# Patient Record
Sex: Male | Born: 1965 | Race: White | Hispanic: No | Marital: Single | Smoking: Never smoker
Health system: Southern US, Community
[De-identification: ages and names within clinical notes are randomized; demographics above are authoritative.]

## PROBLEM LIST (undated history)

## (undated) HISTORY — PX: FINGER SURGERY: SHX640

## (undated) HISTORY — PX: WRIST SURGERY: SHX841

## (undated) HISTORY — PX: ROTATOR CUFF REPAIR: SHX139

---

## 2001-08-08 ENCOUNTER — Emergency Department (HOSPITAL_COMMUNITY): Admission: EM | Admit: 2001-08-08 | Discharge: 2001-08-09 | Payer: Self-pay | Admitting: *Deleted

## 2001-08-08 ENCOUNTER — Encounter: Payer: Self-pay | Admitting: *Deleted

## 2003-07-25 ENCOUNTER — Ambulatory Visit (HOSPITAL_BASED_OUTPATIENT_CLINIC_OR_DEPARTMENT_OTHER): Admission: RE | Admit: 2003-07-25 | Discharge: 2003-07-25 | Payer: Self-pay | Admitting: Orthopedic Surgery

## 2005-03-22 ENCOUNTER — Ambulatory Visit (HOSPITAL_COMMUNITY): Admission: RE | Admit: 2005-03-22 | Discharge: 2005-03-22 | Payer: Self-pay | Admitting: Family Medicine

## 2006-02-08 ENCOUNTER — Ambulatory Visit (HOSPITAL_COMMUNITY): Admission: RE | Admit: 2006-02-08 | Discharge: 2006-02-08 | Payer: Self-pay | Admitting: Family Medicine

## 2007-11-28 ENCOUNTER — Ambulatory Visit (HOSPITAL_COMMUNITY): Admission: RE | Admit: 2007-11-28 | Discharge: 2007-11-28 | Payer: Self-pay | Admitting: Family Medicine

## 2007-12-26 ENCOUNTER — Ambulatory Visit: Payer: Self-pay | Admitting: Internal Medicine

## 2008-01-05 ENCOUNTER — Emergency Department (HOSPITAL_COMMUNITY): Admission: EM | Admit: 2008-01-05 | Discharge: 2008-01-05 | Payer: Self-pay | Admitting: Emergency Medicine

## 2008-10-29 ENCOUNTER — Encounter: Admission: RE | Admit: 2008-10-29 | Discharge: 2008-10-29 | Payer: Self-pay | Admitting: Occupational Medicine

## 2011-04-19 NOTE — Assessment & Plan Note (Signed)
NAME:  Eugene Horn, Eugene Horn                CHART#:  [id number]   DATE:  12/26/2007                       DOB:  18-Apr-1966   REFERRING PHYSICIAN:  Scott A. Gerda Diss, M.D.   REASON FOR CONSULTATION:  Elevated liver enzymes.   Eugene Horn is a very pleasant 45 year old Caucasian male sent  over per Dr. Lilyan Punt to further evaluate elevated transaminases.  He was seen last month by Dr. Gerda Diss for what Eugene Horn describes as a  viral syndrome.  According to Dr. Fletcher Anon record, he had a 5-6 day  history of headache, low-grade fever and malaise.  He was, indeed, felt  to have a viral syndrome.  Labs were drawn, and on November 16, 2007, he  was noted to have an AST and ALT of 82 and 94 respectively.  All other  liver parameters were normal.  His amylase came back at 46.  Of note, he  did have a depressed total white count of 2600.  A differential was not  done.  On November 22, 2007, a repeat hepatic profile revealed AST and  ALT of 74 and 123.  At that time, his white count was in the normal  range at 4.7.   Eugene Horn was jaundiced as a newborn, but denies any history of  yellow jaundice or having been told he had hepatitis previously.  He has  never donated blood to the [employer].  He has never received a blood  transfusion.  He denies tattoos, sexual promiscuity or drug use.  He  does take glutamine, a dietary supplement, regularly when he works out,  which predominantly involves weight lifting.  He has never been told  previously that he recalls that his LFT's have been abnormal.  An  ultrasound of the liver was done, which revealed some degree of fatty  infiltration of the liver.  Otherwise, there were no space occupying  lesions or other abnormality.  Additional labs followed on December 03, 2007 including a negative hepatitis C antibody, a negative BE antibody,  hepatitis B surface antibody, hepatitis A total and hepatitis B core  total antibody all came back negative.   His ferritin was normal at 174.  I do not see where he had a hepatitis B surface antigen done, however.   It is also notable that Eugene Horn was taking a fair amount of ibuprofen  during the week of his illness when he sought out help through Dr.  Fletcher Anon office.   PAST MEDICAL HISTORY:  Unremarkable for any chronic illnesses.  He has a  history of undergoing surgery for a fractured wrist years ago.   MEDICATIONS:  He currently is taking no prescribed medications, but,  again, does take over-the-counter [employer] glutamine.   ALLERGIES:  No known drug allergies.   FAMILY HISTORY:  Mother is in good health at age 68.  Father is age 44  in good health with hypertension.  He has 1 brother and 1 sister, both  in good health.   SOCIAL HISTORY:  The patient is single.  He has no children.  He works  for [employer]  He works in the office.  He is not exposed to any  chemicals as far as he knows.  No tobacco, no alcohol, no illicit drug  exposure.   REVIEW  OF SYSTEMS:  As in history of present illness.  Also, he denies  reflux symptoms, odynophagia, dysphagia, early satiety, abdominal pain,  nausea, vomiting, melena, hematochezia, constipation, diarrhea.  He  denies weight loss, fever, chills, dyspnea or chest pain.   PHYSICAL EXAMINATION:  GENERAL:  A pleasant 45 year old gentleman.  VITAL SIGNS:  Weight 200, height 5 feet, 10 inches.  Temperature 97.9,  blood pressure 110/78, pulse 64.  SKIN:  Warm and dry.  There is no jaundice.  No continued stigmata of  chronic liver disease.  HEENT:  No sclerae icterus.  Conjunctivae are pink.  Oral cavity without  lesions.  CHEST:  Lungs are clear to auscultation.  CARDIAC:  Regular rate and rhythm without murmur, gallop or rub.  ABDOMEN:  Nondistended.  Positive bowel sounds, soft, nontender.  No  appreciable mass or organomegaly.  EXTREMITIES:  No edema.   IMPRESSION:  Eugene Horn is a very pleasant 45 year old gentleman with a  nonspecific  elevation in his aminotransferases.  Clinically, these were  noted in a setting of symptoms consistent with a viral syndrome.  He had  associated leukopenia.  Serologies were negative for hepatitic C and  probably negative for hepatitis B, but I did not see where a hepatitis B  surface antigen had been done.  His ferritin was normal.  He has a fatty-  appearing liver on ultrasound, and he is a good 20-25 pounds over his  ideal body weight for body frame/height.   RECOMMENDATIONS:  At this point in time, we would simply like to repeat  a hepatitic profile to see if his transaminases haven't normalized.  Will also draw a hepatitis B surface antigen to complete screening for  viral hepatitis at this time.   He takes glutamine on a regular basis, and it is unknown whether or not  glutamine in significant doses could negatively impact on his  aminotransferases.   A vital syndrome with nonsteroidal anti-inflammatory drug use together  could be more of the culprit in this particular clinical setting.   He may well have an element of underlying steatohepatitis, as well.   RECOMMENDATIONS:  1. Will go ahead and simply repeat a hepatic profile now and obtain a      hepatitis C surface antigen.  2. Regardless of the above findings, I have recommended to Eugene Horn      he plan on getting more aerobic exercise; ideally, 30 minutes of      aerobic exercise every other day or three times weekly and strive      towards an ideal body weight for height/body frame.  Will make      further recommendations in the very near future.   I would like to thank Dr. Lilyan Punt for allowing Horn to see this nice  gentleman today.       Jonathon Bellows, M.D.  Electronically Signed     RMR/MEDQ  D:  12/26/2007  T:  12/26/2007  Job:  [id number]   cc:   Lorin Picket A. Gerda Diss, MD

## 2011-04-22 NOTE — Op Note (Signed)
   NAME:  [name], [name]                         ACCOUNT NO.:  [id number]   MEDICAL RECORD NO.:  [id number]                   PATIENT TYPE:  AMB   LOCATION:  [facility]                                  FACILITY:  [facility]   PHYSICIAN:  Cindee Salt, M.D.                    DATE OF BIRTH:  01-May-1966   DATE OF PROCEDURE:  07/25/2003  DATE OF DISCHARGE:                                 OPERATIVE REPORT   PREOPERATIVE DIAGNOSIS:  Fracture distal condyles, middle phalanx of left  ring finger.   POSTOPERATIVE DIAGNOSIS:  Fracture distal condyles, middle phalanx of left  ring finger.   PROCEDURE:  Closed manipulation and reduction and percutaneous pinning, left  ring finger.   SURGEON:  Cindee Salt, M.D.   ASSISTANTCarolyne Fiscal.   ANESTHESIA:  General.   HISTORY:  The patient is a 45 year old male who suffered a fracture of his  left ring finger playing softball.  It is approximately three weeks old.   DESCRIPTION OF PROCEDURE:  The patient was brought to the operating room, a  general anesthetic carried out without difficulty.  He was prepped using  Duraprep in a supine position, left arm free.  The fracture was reduced  under image intensification.  This was then percutaneously pinned with two  crossed 0.028 K-wires.  AP and lateral oblique x-rays revealed that the  fracture was realigned.  The pins were bent, cut short.  A sterile  compressive dressing and splint to the finger were applied.  The patient  tolerated the procedure well and was taken to the recovery room for  observation in satisfactory condition.  He is discharged home to return to  the [facility] [place] in one week, on Vicodin and Keflex.                                               Cindee Salt, M.D.    Angelique Blonder  D:  07/25/2003  T:  07/26/2003  Job:  [id number]

## 2013-03-09 ENCOUNTER — Encounter (INDEPENDENT_AMBULATORY_CARE_PROVIDER_SITE_OTHER): Payer: Self-pay | Admitting: Ophthalmology

## 2013-12-11 ENCOUNTER — Ambulatory Visit (INDEPENDENT_AMBULATORY_CARE_PROVIDER_SITE_OTHER): Payer: BC Managed Care – PPO | Admitting: Family Medicine

## 2013-12-11 ENCOUNTER — Encounter: Payer: Self-pay | Admitting: Family Medicine

## 2013-12-11 VITALS — BP 102/80 | Ht 70.0 in | Wt 212.5 lb

## 2013-12-11 DIAGNOSIS — J322 Chronic ethmoidal sinusitis: Secondary | ICD-10-CM

## 2013-12-11 MED ORDER — AMOXICILLIN-POT CLAVULANATE 875-125 MG PO TABS
1.0000 | ORAL_TABLET | Freq: Two times a day (BID) | ORAL | Status: AC
Start: 2013-12-11 — End: 2013-12-21

## 2013-12-11 NOTE — Progress Notes (Signed)
   Subjective:    Patient ID: Eugene Horn, male    DOB: 01/25/1966, 48 y.o.   MRN: [id number]  Sinusitis This is a new problem. The current episode started 1 to 4 weeks ago. The problem has been gradually worsening since onset. Maximum temperature: 99. The fever has been present for less than 1 day. His pain is at a severity of 2/10. The pain is moderate. Associated symptoms include congestion and coughing. Past treatments include nothing. The treatment provided no relief.    persisitent cong and drainage leading to cough  No fever  chlorcetin prn cough, Rob CF,  Pos rexsp infxn Pos exposure  Review of Systems  HENT: Positive for congestion.   Respiratory: Positive for cough.    no vomiting no diarrhea no rash ROS otherwise negative     Objective:   Physical Exam  Alert hydration good. Moderate malaise. Cough during exam. Frontal maxillary tenderness evident. Trace normal neck supple. Lungs clear heart regular in rhythm.      Assessment & Plan:  Impression 1 acute rhinosinusitis with bronchitis plan Augmentin twice a day 10 days symptomatic

## 2014-07-14 ENCOUNTER — Encounter: Payer: Self-pay | Admitting: Family Medicine

## 2014-07-14 ENCOUNTER — Ambulatory Visit (INDEPENDENT_AMBULATORY_CARE_PROVIDER_SITE_OTHER): Payer: BC Managed Care – PPO | Admitting: Family Medicine

## 2014-07-14 VITALS — BP 138/86 | Ht 70.0 in | Wt 205.0 lb

## 2014-07-14 DIAGNOSIS — IMO0002 Reserved for concepts with insufficient information to code with codable children: Secondary | ICD-10-CM

## 2014-07-14 DIAGNOSIS — S93609A Unspecified sprain of unspecified foot, initial encounter: Secondary | ICD-10-CM

## 2014-07-14 DIAGNOSIS — S93602S Unspecified sprain of left foot, sequela: Secondary | ICD-10-CM

## 2014-07-14 NOTE — Progress Notes (Signed)
   Subjective:    Patient ID: Eugene Horn, male    DOB: 09/04/1966, 48 y.o.   MRN: [id number]  HPI  Patient jumped off ladder to avoid wasps and landed bad. Patient states he went to Urgent care and they x-rayed it and told him it was not broke and he was given an rx for diclofenac  Review of Systems    see above. Patient complains of pain and discomfort with walking Objective:   Physical Exam Severely swollen foot swelling pain discomfort on the posterior aspect [facility] overall looks good       Assessment & Plan:  I expect slow recovery. If the patient is not able to put weight on it by the middle of next week then the next step would be physical therapy. I see no sign of fracture. If he does not make good improvement over the next few weeks he may need to have some orthopedic referral the possibility of MRI

## 2014-09-11 ENCOUNTER — Telehealth: Payer: Self-pay | Admitting: Family Medicine

## 2014-09-11 DIAGNOSIS — M79673 Pain in unspecified foot: Secondary | ICD-10-CM

## 2014-09-11 NOTE — Telephone Encounter (Signed)
Nurses thought with the patient. [facility] They can do a good job for him. Discussed with patient first and then put referral in thank you reason for referral is persistent foot pain

## 2014-09-11 NOTE — Telephone Encounter (Signed)
[place] still having problems with his foot. Last visit was 07/14/14.

## 2014-09-12 NOTE — Telephone Encounter (Signed)
Referral initiated in system. Patient was notified.

## 2014-09-12 NOTE — Addendum Note (Signed)
Addended by: Dereck LigasJOHNSON, Manuella Blackson P on: 09/12/2014 11:03 AM   Modules accepted: Orders

## 2015-05-18 ENCOUNTER — Encounter: Payer: Self-pay | Admitting: Family Medicine

## 2015-05-18 ENCOUNTER — Ambulatory Visit (HOSPITAL_COMMUNITY)
Admission: RE | Admit: 2015-05-18 | Discharge: 2015-05-18 | Disposition: A | Discharge status: Home / Self Care | Payer: BLUE CROSS/BLUE SHIELD | Source: Ambulatory Visit | Attending: Family Medicine | Admitting: Family Medicine

## 2015-05-18 ENCOUNTER — Ambulatory Visit (INDEPENDENT_AMBULATORY_CARE_PROVIDER_SITE_OTHER): Payer: BLUE CROSS/BLUE SHIELD | Admitting: Family Medicine

## 2015-05-18 VITALS — BP 118/88 | Ht 70.0 in | Wt 207.0 lb

## 2015-05-18 DIAGNOSIS — R079 Chest pain, unspecified: Secondary | ICD-10-CM | POA: Diagnosis not present

## 2015-05-18 MED ORDER — NABUMETONE 750 MG PO TABS
750.0000 mg | ORAL_TABLET | Freq: Two times a day (BID) | ORAL | Status: DC
Start: 2015-05-18 — End: 2016-02-02

## 2015-05-18 NOTE — Progress Notes (Signed)
   Subjective:    Patient ID: Eugene Horn, male    DOB: [birth year], 49 y.o.   MRN: [id number]  Chest Pain  This is a new problem. Episode onset: 3 weeks ago. Pain location: right side. The quality of the pain is described as tightness. Associated symptoms comments: Pain worse when sneezing or coughing. He has tried NSAIDs (ice) for the symptoms. The treatment provided no relief.   sarted a few wks ago.  Played ball and got fouled pretty good,  Took a falll not sure if fell on involved side  A few days later started hurtng    Right lat chest wall tend, and sore  Took ibu the first wk,,then stopped  This weekend hurt a bit more, worse with sudden motion and sneezing and cough  Non smker   Still exrcising  No hx of cracked ribs  No sec exposure    Review of Systems  Cardiovascular: Positive for chest pain.   no vomiting no diarrhea good appetite     Objective:   Physical Exam  Alert vitals stable lungs clear. Heart regular in rhythm right lateral chest tenderness deep palpation lungs clear heart regular rhythm abdomen benign      Assessment & Plan:  Impression likely contusion with secondary costochondritis discussed plan chest x-ray. Trial of Relafen twice a day. Symptom care discussed WSL

## 2016-02-02 ENCOUNTER — Ambulatory Visit (INDEPENDENT_AMBULATORY_CARE_PROVIDER_SITE_OTHER): Payer: BLUE CROSS/BLUE SHIELD | Admitting: Family Medicine

## 2016-02-02 ENCOUNTER — Encounter: Payer: Self-pay | Admitting: Family Medicine

## 2016-02-02 VITALS — BP 124/86 | Temp 100.2°F | Ht 70.0 in | Wt 223.0 lb

## 2016-02-02 DIAGNOSIS — J111 Influenza due to unidentified influenza virus with other respiratory manifestations: Secondary | ICD-10-CM | POA: Diagnosis not present

## 2016-02-02 MED ORDER — OSELTAMIVIR PHOSPHATE 75 MG PO CAPS: 75.0000 mg | ORAL_CAPSULE | Freq: Two times a day (BID) | ORAL | Status: DC

## 2016-02-02 NOTE — Progress Notes (Signed)
   Subjective:    Patient ID: Eugene Horn, male    DOB: 08/25/1966, 50 y.o.   MRN: [id number]  Cough This is a new problem. Episode onset: 2 days ago. Associated symptoms include a fever, nasal congestion and a sore throat. Treatments tried: mucinex DM.   Patient with onset over the past 36 hours sore throat runny nose head congestion cough body aches headache low-grade fever no wheezing or difficulty breathing no nausea vomiting diarrhea   Review of Systems  Constitutional: Positive for fever.  HENT: Positive for sore throat.   Respiratory: Positive for cough.        Objective:   Physical Exam Sinus nontender throat normal neck supple lungs clear heart regular.   I do not find evidence of bacterial infection if progressive trouble follow-up    Assessment & Plan:  flulike illness Influenza-the patient was diagnosed with influenza. Patient/family educated about the flu and warning signs to watch for. If difficulty breathing, severe neck pain and stiffness, cyanosis, disorientation, or progressive worsening then immediately get rechecked at that ER. If progressive symptoms be certain to be rechecked. Supportive measures such as Tylenol/ibuprofen was discussed. No aspirin use in children. And influenza home care instruction sheet was given.

## 2016-02-02 NOTE — Patient Instructions (Signed)

## 2016-02-09 ENCOUNTER — Telehealth: Payer: Self-pay | Admitting: Family Medicine

## 2016-02-09 NOTE — Telephone Encounter (Signed)
Nurses please discuss the case with patient making sure he is not short of breath or running high fevers if he is short of breath or running high fevers I recommend to be rechecked. If it is more just congestion and coughing I recommend Z-Pak with a follow-up office visit if ongoing troubles

## 2016-02-09 NOTE — Telephone Encounter (Signed)
Was told to call back if he got post flu symptoms  He has cough and chest congestion now if you could go ahead And send [facility] a [facility]

## 2016-02-10 MED ORDER — AZITHROMYCIN 250 MG PO TABS: ORAL_TABLET | ORAL | Status: DC

## 2016-02-10 NOTE — Telephone Encounter (Signed)
Spoke with patient and discussed symptoms. Patient stated he just has cough and congestion. Denies shortness of breath and high fevers. Informed patient per Dr.Scott Luking that we can send in Zpak for cough with a follow up office visit if ongoing troubles. Patient verbalized understanding. [facility]pak sent to [facility].

## 2016-02-10 NOTE — Telephone Encounter (Signed)
[facility] 02/10/16 

## 2017-06-01 ENCOUNTER — Ambulatory Visit (INDEPENDENT_AMBULATORY_CARE_PROVIDER_SITE_OTHER): Payer: BLUE CROSS/BLUE SHIELD | Admitting: Family Medicine

## 2017-06-01 ENCOUNTER — Encounter: Payer: Self-pay | Admitting: Family Medicine

## 2017-06-01 VITALS — BP 118/74 | Ht 69.0 in | Wt 220.0 lb

## 2017-06-01 DIAGNOSIS — Z0000 Encounter for general adult medical examination without abnormal findings: Secondary | ICD-10-CM

## 2017-06-01 DIAGNOSIS — R0789 Other chest pain: Secondary | ICD-10-CM

## 2017-06-01 DIAGNOSIS — Z13220 Encounter for screening for lipoid disorders: Secondary | ICD-10-CM

## 2017-06-01 NOTE — Progress Notes (Signed)
Subjective:    Patient ID: Eugene Horn, male    DOB: 02/08/1966, 51 y.o.   MRN: [id number]  HPI The patient comes in today for a wellness visit.    A review of their health history was completed.  A review of medications was also completed.  Any needed refills; not taking any meds  Eating habits: health conscious  Falls/  MVA accidents in past few months: none  Regular exercise: workout 2 days a week  Specialist pt sees on regular basis: none  Preventative health issues were discussed.   Additional concerns: none Patient does relate that he's had chest tightness. He states he had a friend who died unexpectedly at age 51 this proved him. He states he is having some tightness across the lower chest. He denies any injury to it. Denies injuring it with lifting does a lot of weight lifting. He does not do excessive amount of cardio. When he does do cardio he does not experience any type of chest tightness he states when the chest tightness does occur it's in the lower chest and is from the right shoulder region to the lower chest he is had previous right shoulder troubles he does not have any family history of premature heart disease   Review of Systems  Constitutional: Negative for activity change, fatigue and fever.  Respiratory: Negative for cough and shortness of breath.   Cardiovascular: Negative for chest pain and leg swelling.  Neurological: Negative for headaches.       Objective:   Physical Exam  Constitutional: He appears well-developed and well-nourished.  HENT:  Head: Normocephalic and atraumatic.  Right Ear: External ear normal.  Left Ear: External ear normal.  Nose: Nose normal.  Mouth/Throat: Oropharynx is clear and moist.  Eyes: EOM are normal. Pupils are equal, round, and reactive to light.  Neck: Normal range of motion. Neck supple. No thyromegaly present.  Cardiovascular: Normal rate, regular rhythm and normal heart sounds.   No murmur  heard. Pulmonary/Chest: Effort normal and breath sounds normal. No respiratory distress. He has no wheezes.  Abdominal: Soft. Bowel sounds are normal. He exhibits no distension and no mass. There is no tenderness.  Genitourinary: Penis normal.  Musculoskeletal: Normal range of motion. He exhibits no edema.  Lymphadenopathy:    He has no cervical adenopathy.  Neurological: He is alert. He exhibits normal muscle tone.  Skin: Skin is warm and dry. No erythema.  Psychiatric: He has a normal mood and affect. His behavior is normal. Judgment normal.          Assessment & Plan:  Adult wellness-complete.wellness physical was conducted today. Importance of diet and exercise were discussed in detail. In addition to this a discussion regarding safety was also covered. We also reviewed over immunizations and gave recommendations regarding current immunization needed for age. In addition to this additional areas were also touched on including: Preventative health exams needed: Colonoscopy not indicated currently-insurance companies will not reimburse for colonoscopy until age 49 no family history  Patient was advised yearly wellness exam  Prostate exam on next physical Lipid metastases 7 ordered Importance of losing weight discuss  Chest tightness I do not feel that this is coronary. Regular physical exercise recommended along with healthy eating and losing weight if ongoing troubles follow-up EKG looks good Patient was encouraged to stay physically active on a regular basis and also encouraged to do cardio in if he has any chest tightness pressure pain with cardio to report to us  ASAP

## 2017-06-12 ENCOUNTER — Encounter: Payer: Self-pay | Admitting: Family Medicine

## 2017-09-26 ENCOUNTER — Telehealth: Payer: Self-pay | Admitting: *Deleted

## 2017-09-26 DIAGNOSIS — Z139 Encounter for screening, unspecified: Secondary | ICD-10-CM

## 2017-09-26 DIAGNOSIS — Z131 Encounter for screening for diabetes mellitus: Secondary | ICD-10-CM

## 2017-09-26 DIAGNOSIS — Z13220 Encounter for screening for lipoid disorders: Secondary | ICD-10-CM

## 2017-09-26 NOTE — Telephone Encounter (Signed)
Patient had lipid and liver 05/2017 (son at age 49 should have been vaccinated against varicella?)

## 2017-09-26 NOTE — Telephone Encounter (Signed)
Patient called with questions about getting lab work done, patient also stated he doesn't think he had chicken pox's as a kid and he would like to know if he can get checked for that when he does labs because he has a son who is 4 and he has not had them yet. Please advise [phone]

## 2017-09-26 NOTE — Telephone Encounter (Signed)
Varicella antibody, lipid, liver, metabolic 7

## 2017-09-27 NOTE — Telephone Encounter (Signed)
Blood work ordered in EPIC. Patient notified. 

## 2017-09-29 LAB — LIPID PANEL
CHOLESTEROL TOTAL: 145 mg/dL (ref 100–199)
Chol/HDL Ratio: 4.1 ratio (ref 0.0–5.0)
HDL: 35 mg/dL — AB (ref >39)
LDL Calculated: 95 mg/dL (ref 0–99)
Triglycerides: 77 mg/dL (ref 0–149)
VLDL CHOLESTEROL CAL: 15 mg/dL (ref 5–40)

## 2017-09-29 LAB — BASIC METABOLIC PANEL
BUN/Creatinine Ratio: 21 — ABNORMAL HIGH (ref 9–20)
BUN: 18 mg/dL (ref 6–24)
CO2: 24 mmol/L (ref 20–29)
Calcium: 9.2 mg/dL (ref 8.7–10.2)
Chloride: 104 mmol/L (ref 96–106)
Creatinine, Ser: 0.86 mg/dL (ref 0.76–1.27)
GFR calc Af Amer: 118 mL/min/{1.73_m2} (ref >59)
GFR calc non Af Amer: 102 mL/min/{1.73_m2} (ref >59)
GLUCOSE: 92 mg/dL (ref 65–99)
POTASSIUM: 4.5 mmol/L (ref 3.5–5.2)
SODIUM: 140 mmol/L (ref 134–144)

## 2017-09-29 LAB — HEPATIC FUNCTION PANEL
ALK PHOS: 95 IU/L (ref 39–117)
ALT: 17 IU/L (ref 0–44)
AST: 17 IU/L (ref 0–40)
Albumin: 4.4 g/dL (ref 3.5–5.5)
Bilirubin Total: 0.4 mg/dL (ref 0.0–1.2)
Bilirubin, Direct: 0.1 mg/dL (ref 0.00–0.40)
TOTAL PROTEIN: 6.7 g/dL (ref 6.0–8.5)

## 2017-09-29 LAB — VARICELLA ZOSTER ANTIBODY, IGG: VARICELLA: 629 {index} (ref >165)

## 2017-11-03 ENCOUNTER — Ambulatory Visit: Payer: BLUE CROSS/BLUE SHIELD | Admitting: Nurse Practitioner

## 2017-11-03 ENCOUNTER — Encounter: Payer: Self-pay | Admitting: Nurse Practitioner

## 2017-11-03 ENCOUNTER — Ambulatory Visit (HOSPITAL_COMMUNITY)
Admission: RE | Admit: 2017-11-03 | Discharge: 2017-11-03 | Disposition: A | Discharge status: Home / Self Care | Payer: BLUE CROSS/BLUE SHIELD | Source: Ambulatory Visit | Attending: Nurse Practitioner | Admitting: Nurse Practitioner

## 2017-11-03 VITALS — BP 122/82 | Ht 69.0 in | Wt 220.4 lb

## 2017-11-03 DIAGNOSIS — S0083XA Contusion of other part of head, initial encounter: Secondary | ICD-10-CM

## 2017-11-03 DIAGNOSIS — X58XXXA Exposure to other specified factors, initial encounter: Secondary | ICD-10-CM | POA: Insufficient documentation

## 2017-11-03 NOTE — Progress Notes (Signed)
Subjective: Presents for complaints of pain and swelling along the left side of the nose near the eye after getting elbowed in the area while playing basketball about 12 hours ago.  States his nose was dislocated, patient put nose back in place himself.  Had a lot of bleeding afterwards which has stopped.  Minimal headache.  No loss of consciousness.  No blurred vision.  No nausea vomiting.  No dizziness.  Objective:   BP 122/82   Ht 5\' 9"  (1.753 m)   Wt 220 lb 6.4 oz (100 kg)   BMI 32.55 kg/m  NAD.  Alert, oriented.  Pupils equal and reactive to light.  Funduscopic exam normal limit.  EOMs intact without nystagmus.  Very faint linear ecchymotic area noted along the left lateral upper nasal area with mild edema.  Nose is very slightly out of alignment but this could be normal.  Nasal cavities are clear.  Faint ecchymotic area noted around the inner left eye but no edema.  No tenderness with palpation around the right eye.  Mild tenderness around the left eye towards the upper nose.  Assessment:  Contusion of face, initial encounter - Plan: DG Facial Bones Complete    Plan: Facial x-ray pending ibuprofen as directed for discomfort.  Ice applications for the next 48 hours.  Further follow-up based on x-ray results.  Warning signs reviewed regarding concussion as a precaution.

## 2018-05-23 IMAGING — DX DG FACIAL BONES COMPLETE 3+V
3 series · 3 of 3 positions shown · non-contrast
Comparison: None.

CLINICAL DATA: Hit in the left eye and along the left nose last
night.

EXAM:
FACIAL BONES COMPLETE 3+V

[facial waters]
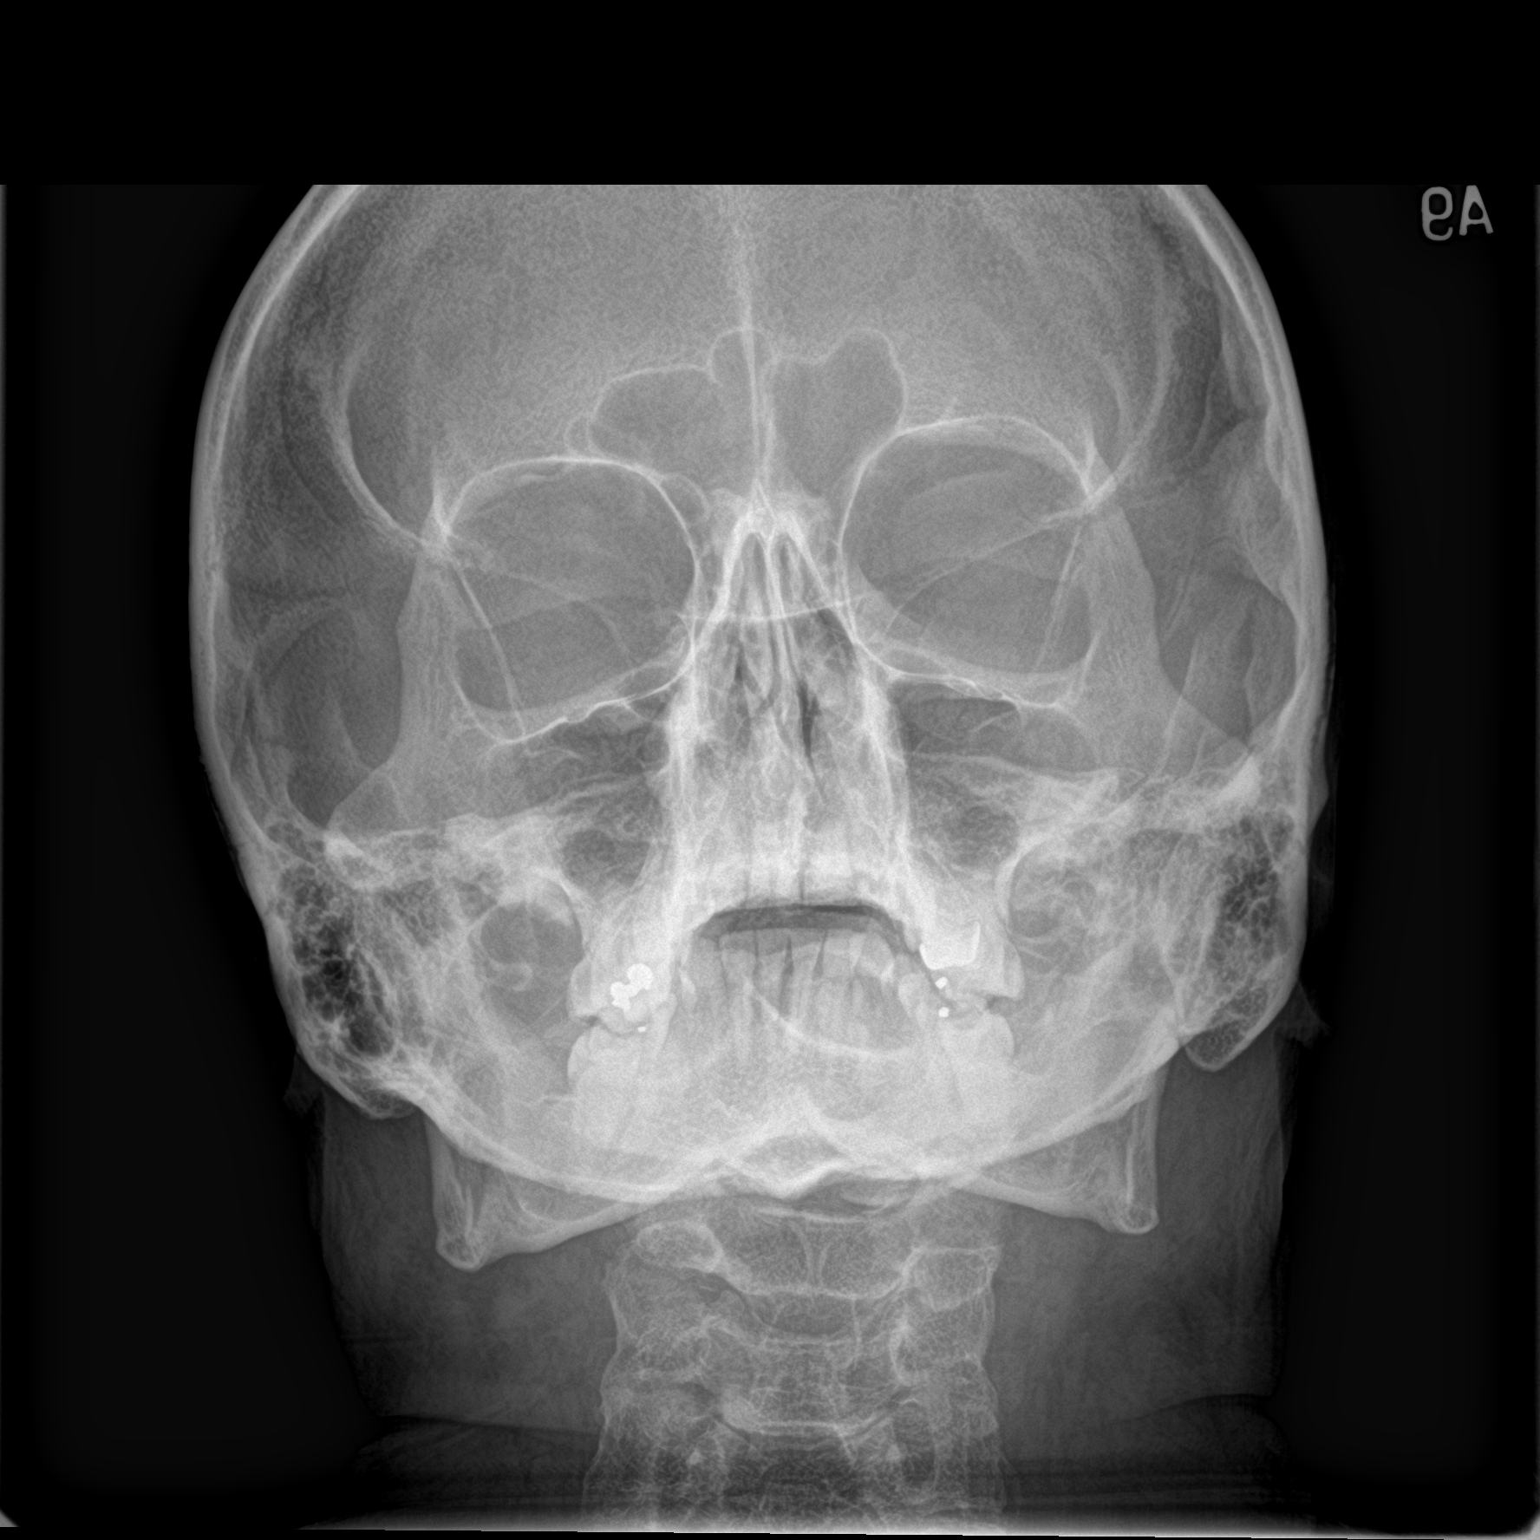

[facial lateral]
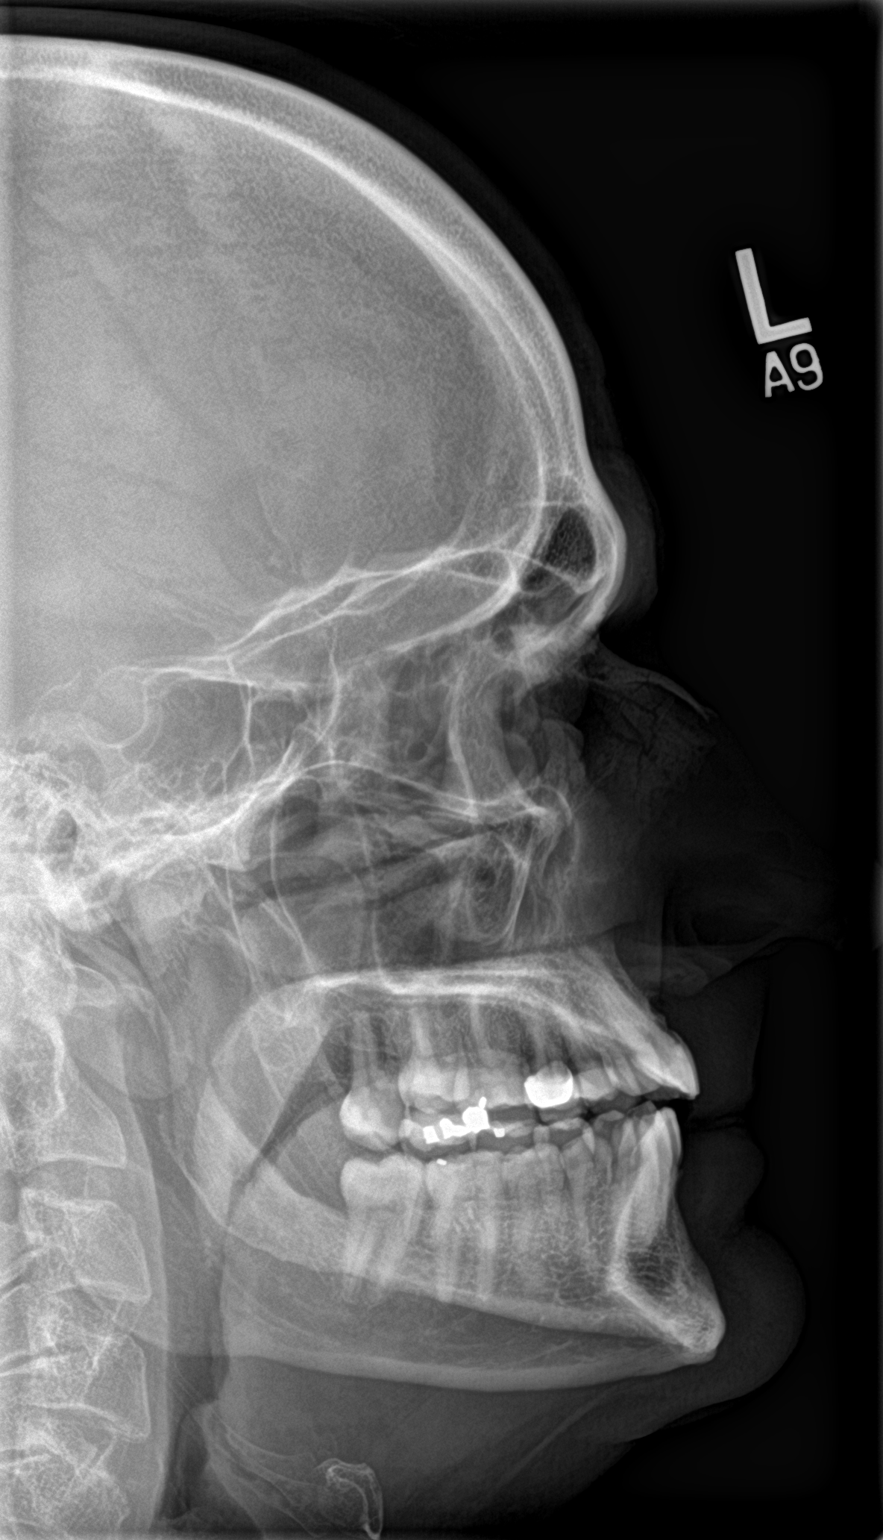

[mandible townes]
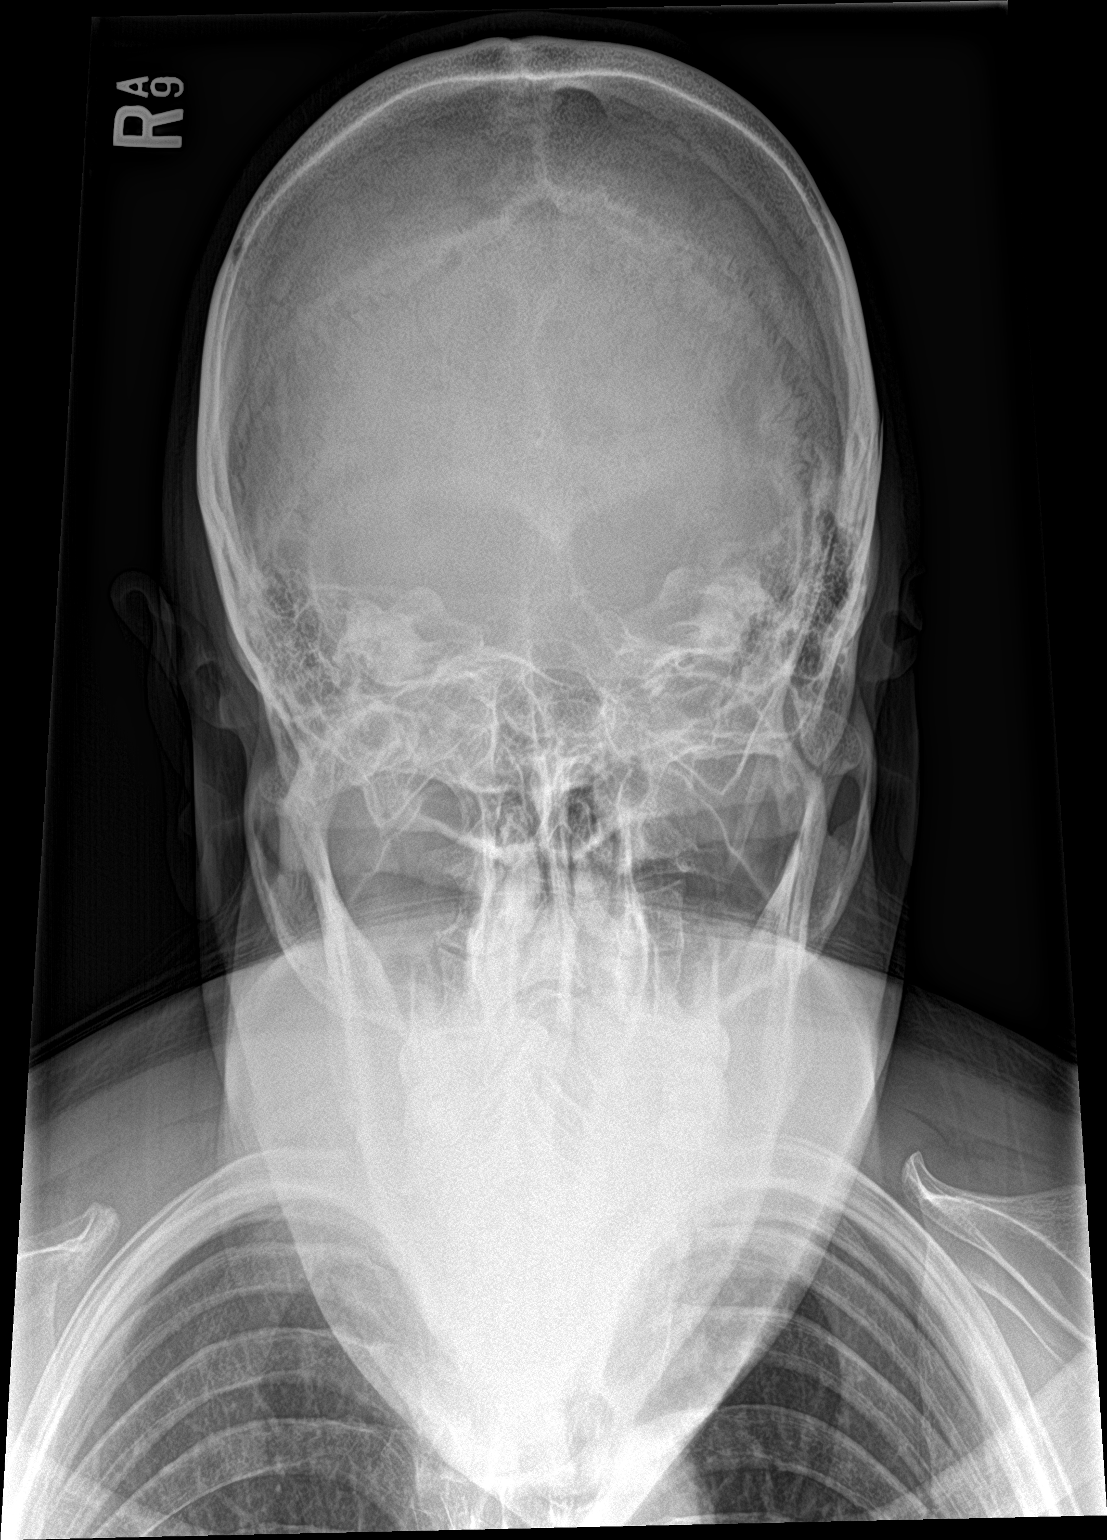

[3 of 3 positions shown; findings below may reference images not displayed]

FINDINGS: There is no evidence of fracture or other significant bone
abnormality. No orbital emphysema or sinus air-fluid levels are
seen.
IMPRESSION: Negative.

## 2019-09-12 ENCOUNTER — Telehealth: Payer: Self-pay | Admitting: Family Medicine

## 2019-09-12 DIAGNOSIS — Z13220 Encounter for screening for lipoid disorders: Secondary | ICD-10-CM

## 2019-09-12 DIAGNOSIS — Z131 Encounter for screening for diabetes mellitus: Secondary | ICD-10-CM

## 2019-09-12 DIAGNOSIS — R5383 Other fatigue: Secondary | ICD-10-CM

## 2019-09-12 DIAGNOSIS — Z125 Encounter for screening for malignant neoplasm of prostate: Secondary | ICD-10-CM

## 2019-09-12 DIAGNOSIS — Z1211 Encounter for screening for malignant neoplasm of colon: Secondary | ICD-10-CM

## 2019-09-12 NOTE — Telephone Encounter (Signed)
Pt having a physical & needs "complete" blood work   Please call when lab orders are ready   Pt requesting referral to Dr. Earlean Shawl for a screening colonoscopy Fax# [phone]  Please advise & if OK to refer initiate referral in system so that I may process

## 2019-09-12 NOTE — Telephone Encounter (Signed)
Lipid liver met 7 cbc psa

## 2019-09-13 NOTE — Telephone Encounter (Signed)
Blood work ordered in [employer]. Referral ordered in Epic. Patient notified.

## 2019-09-13 NOTE — Telephone Encounter (Signed)
May go ahead with referral [facility]

## 2019-09-13 NOTE — Telephone Encounter (Signed)
Blood work ordered in [employer] Patient notified. Do you want to process screening colonoscopy now or wait till physical to discuss with patient. Please advise

## 2019-09-20 LAB — BASIC METABOLIC PANEL
BUN/Creatinine Ratio: 18 (ref 9–20)
BUN: 18 mg/dL (ref 6–24)
CO2: 24 mmol/L (ref 20–29)
Calcium: 9.3 mg/dL (ref 8.7–10.2)
Chloride: 104 mmol/L (ref 96–106)
Creatinine, Ser: 1 mg/dL (ref 0.76–1.27)
GFR calc Af Amer: 100 mL/min/{1.73_m2} (ref >59)
GFR calc non Af Amer: 87 mL/min/{1.73_m2} (ref >59)
Glucose: 100 mg/dL — ABNORMAL HIGH (ref 65–99)
Potassium: 4.6 mmol/L (ref 3.5–5.2)
Sodium: 141 mmol/L (ref 134–144)

## 2019-09-20 LAB — CBC WITH DIFFERENTIAL/PLATELET
Basophils Absolute: 0.1 10*3/uL (ref 0.0–0.2)
Basos: 1 %
EOS (ABSOLUTE): 0.2 10*3/uL (ref 0.0–0.4)
Eos: 4 %
Hematocrit: 47.6 % (ref 37.5–51.0)
Hemoglobin: 15.8 g/dL (ref 13.0–17.7)
Immature Grans (Abs): 0 10*3/uL (ref 0.0–0.1)
Immature Granulocytes: 1 %
Lymphocytes Absolute: 1.9 10*3/uL (ref 0.7–3.1)
Lymphs: 30 %
MCH: 30 pg (ref 26.6–33.0)
MCHC: 33.2 g/dL (ref 31.5–35.7)
MCV: 91 fL (ref 79–97)
Monocytes Absolute: 0.4 10*3/uL (ref 0.1–0.9)
Monocytes: 7 %
Neutrophils Absolute: 3.6 10*3/uL (ref 1.4–7.0)
Neutrophils: 57 %
Platelets: 273 10*3/uL (ref 150–450)
RBC: 5.26 x10E6/uL (ref 4.14–5.80)
RDW: 13.1 % (ref 11.6–15.4)
WBC: 6.2 10*3/uL (ref 3.4–10.8)

## 2019-09-20 LAB — LIPID PANEL
Chol/HDL Ratio: 3.9 ratio (ref 0.0–5.0)
Cholesterol, Total: 158 mg/dL (ref 100–199)
HDL: 41 mg/dL (ref >39)
LDL Chol Calc (NIH): 98 mg/dL (ref 0–99)
Triglycerides: 100 mg/dL (ref 0–149)
VLDL Cholesterol Cal: 19 mg/dL (ref 5–40)

## 2019-09-20 LAB — HEPATIC FUNCTION PANEL
ALT: 27 IU/L (ref 0–44)
AST: 27 IU/L (ref 0–40)
Albumin: 4.6 g/dL (ref 3.8–4.9)
Alkaline Phosphatase: 98 IU/L (ref 39–117)
Bilirubin Total: 0.3 mg/dL (ref 0.0–1.2)
Bilirubin, Direct: 0.09 mg/dL (ref 0.00–0.40)
Total Protein: 7.2 g/dL (ref 6.0–8.5)

## 2019-09-20 LAB — PSA: Prostate Specific Ag, Serum: 0.7 ng/mL (ref 0.0–4.0)

## 2019-09-22 ENCOUNTER — Encounter: Payer: Self-pay | Admitting: Family Medicine

## 2019-10-08 ENCOUNTER — Other Ambulatory Visit: Payer: Self-pay

## 2019-10-08 ENCOUNTER — Ambulatory Visit (INDEPENDENT_AMBULATORY_CARE_PROVIDER_SITE_OTHER): Payer: Managed Care, Other (non HMO) | Admitting: Family Medicine

## 2019-10-08 VITALS — BP 120/78 | Temp 98.3°F | Ht 69.0 in | Wt 224.2 lb

## 2019-10-08 DIAGNOSIS — Z0000 Encounter for general adult medical examination without abnormal findings: Secondary | ICD-10-CM | POA: Diagnosis not present

## 2019-10-08 NOTE — Patient Instructions (Signed)
Results for orders placed or performed in visit on 09/12/19  Lipid panel  Result Value Ref Range   Cholesterol, Total 158 100 - 199 mg/dL   Triglycerides 100 0 - 149 mg/dL   HDL 41 >39 mg/dL   VLDL Cholesterol Cal 19 5 - 40 mg/dL   LDL Chol Calc (NIH) 98 0 - 99 mg/dL   Chol/HDL Ratio 3.9 0.0 - 5.0 ratio  Hepatic function panel  Result Value Ref Range   Total Protein 7.2 6.0 - 8.5 g/dL   Albumin 4.6 3.8 - 4.9 g/dL   Bilirubin Total 0.3 0.0 - 1.2 mg/dL   Bilirubin, Direct 0.09 0.00 - 0.40 mg/dL   Alkaline Phosphatase 98 39 - 117 IU/L   AST 27 0 - 40 IU/L   ALT 27 0 - 44 IU/L  Basic metabolic panel  Result Value Ref Range   Glucose 100 (H) 65 - 99 mg/dL   BUN 18 6 - 24 mg/dL   Creatinine, Ser 1.00 0.76 - 1.27 mg/dL   GFR calc non Af Amer 87 >59 mL/min/1.73   GFR calc Af Amer 100 >59 mL/min/1.73   BUN/Creatinine Ratio 18 9 - 20   Sodium 141 134 - 144 mmol/L   Potassium 4.6 3.5 - 5.2 mmol/L   Chloride 104 96 - 106 mmol/L   CO2 24 20 - 29 mmol/L   Calcium 9.3 8.7 - 10.2 mg/dL  CBC with Differential/Platelet  Result Value Ref Range   WBC 6.2 3.4 - 10.8 x10E3/uL   RBC 5.26 4.14 - 5.80 x10E6/uL   Hemoglobin 15.8 13.0 - 17.7 g/dL   Hematocrit 47.6 37.5 - 51.0 %   MCV 91 79 - 97 fL   MCH 30.0 26.6 - 33.0 pg   MCHC 33.2 31.5 - 35.7 g/dL   RDW 13.1 11.6 - 15.4 %   Platelets 273 150 - 450 x10E3/uL   Neutrophils 57 Not Estab. %   Lymphs 30 Not Estab. %   Monocytes 7 Not Estab. %   Eos 4 Not Estab. %   Basos 1 Not Estab. %   Neutrophils Absolute 3.6 1.4 - 7.0 x10E3/uL   Lymphocytes Absolute 1.9 0.7 - 3.1 x10E3/uL   Monocytes Absolute 0.4 0.1 - 0.9 x10E3/uL   EOS (ABSOLUTE) 0.2 0.0 - 0.4 x10E3/uL   Basophils Absolute 0.1 0.0 - 0.2 x10E3/uL   Immature Granulocytes 1 Not Estab. %   Immature Grans (Abs) 0.0 0.0 - 0.1 x10E3/uL  PSA  Result Value Ref Range   Prostate Specific Ag, Serum 0.7 0.0 - 4.0 ng/mL

## 2019-10-08 NOTE — Progress Notes (Signed)
Subjective:    Patient ID: Eugene Horn, male    DOB: 25-Feb-1966, 53 y.o.   MRN: [id number]  HPI  The patient comes in today for a wellness visit. Patient overall tries to eat healthy stay physically active try to lose weight exercising some under some stress but denies being depressed no accidents or injuries will be getting a colonoscopy later this year Results for orders placed or performed in visit on 09/12/19  Lipid panel  Result Value Ref Range   Cholesterol, Total 158 100 - 199 mg/dL   Triglycerides 093 0 - 149 mg/dL   HDL 41 >81 mg/dL   VLDL Cholesterol Cal 19 5 - 40 mg/dL   LDL Chol Calc (NIH) 98 0 - 99 mg/dL   Chol/HDL Ratio 3.9 0.0 - 5.0 ratio  Hepatic function panel  Result Value Ref Range   Total Protein 7.2 6.0 - 8.5 g/dL   Albumin 4.6 3.8 - 4.9 g/dL   Bilirubin Total 0.3 0.0 - 1.2 mg/dL   Bilirubin, Direct 8.29 0.00 - 0.40 mg/dL   Alkaline Phosphatase 98 39 - 117 IU/L   AST 27 0 - 40 IU/L   ALT 27 0 - 44 IU/L  Basic metabolic panel  Result Value Ref Range   Glucose 100 (H) 65 - 99 mg/dL   BUN 18 6 - 24 mg/dL   Creatinine, Ser 9.37 0.76 - 1.27 mg/dL   GFR calc non Af Amer 87 >59 mL/min/1.73   GFR calc Af Amer 100 >59 mL/min/1.73   BUN/Creatinine Ratio 18 9 - 20   Sodium 141 134 - 144 mmol/L   Potassium 4.6 3.5 - 5.2 mmol/L   Chloride 104 96 - 106 mmol/L   CO2 24 20 - 29 mmol/L   Calcium 9.3 8.7 - 10.2 mg/dL  CBC with Differential/Platelet  Result Value Ref Range   WBC 6.2 3.4 - 10.8 x10E3/uL   RBC 5.26 4.14 - 5.80 x10E6/uL   Hemoglobin 15.8 13.0 - 17.7 g/dL   Hematocrit 16.9 67.8 - 51.0 %   MCV 91 79 - 97 fL   MCH 30.0 26.6 - 33.0 pg   MCHC 33.2 31.5 - 35.7 g/dL   RDW 93.8 10.1 - 75.1 %   Platelets 273 150 - 450 x10E3/uL   Neutrophils 57 Not Estab. %   Lymphs 30 Not Estab. %   Monocytes 7 Not Estab. %   Eos 4 Not Estab. %   Basos 1 Not Estab. %   Neutrophils Absolute 3.6 1.4 - 7.0 x10E3/uL   Lymphocytes Absolute 1.9 0.7 - 3.1 x10E3/uL   Monocytes Absolute 0.4 0.1 - 0.9 x10E3/uL   EOS (ABSOLUTE) 0.2 0.0 - 0.4 x10E3/uL   Basophils Absolute 0.1 0.0 - 0.2 x10E3/uL   Immature Granulocytes 1 Not Estab. %   Immature Grans (Abs) 0.0 0.0 - 0.1 x10E3/uL  PSA  Result Value Ref Range   Prostate Specific Ag, Serum 0.7 0.0 - 4.0 ng/mL     A review of their health history was completed.  A review of medications was also completed.  Any needed refills; no  Eating habits: normal  Falls/  MVA accidents in past few months: none  Regular exercise: twice a week  Specialist pt sees on regular basis: no  Preventative health issues were discussed.   Additional concerns: pulling sensation in lower right groin area   Review of Systems  Constitutional: Negative for activity change, appetite change and fever.  HENT: Negative for congestion and rhinorrhea.  Eyes: Negative for discharge.  Respiratory: Negative for cough and wheezing.   Cardiovascular: Negative for chest pain.  Gastrointestinal: Negative for abdominal pain, blood in stool and vomiting.  Genitourinary: Negative for difficulty urinating and frequency.  Musculoskeletal: Negative for neck pain.  Skin: Negative for rash.  Allergic/Immunologic: Negative for environmental allergies and food allergies.  Neurological: Negative for weakness and headaches.  Psychiatric/Behavioral: Negative for agitation.       Objective:   Physical Exam Constitutional:      Appearance: He is well-developed.  HENT:     Head: Normocephalic and atraumatic.     Right Ear: External ear normal.     Left Ear: External ear normal.     Nose: Nose normal.  Eyes:     Pupils: Pupils are equal, round, and reactive to light.  Neck:     Musculoskeletal: Normal range of motion and neck supple.     Thyroid: No thyromegaly.  Cardiovascular:     Rate and Rhythm: Normal rate and regular rhythm.     Heart sounds: Normal heart sounds. No murmur.  Pulmonary:     Effort: Pulmonary effort is normal.  No respiratory distress.     Breath sounds: Normal breath sounds. No wheezing.  Abdominal:     General: Bowel sounds are normal. There is no distension.     Palpations: Abdomen is soft. There is no mass.     Tenderness: There is no abdominal tenderness.  Genitourinary:    Penis: Normal.   Musculoskeletal: Normal range of motion.  Lymphadenopathy:     Cervical: No cervical adenopathy.  Skin:    General: Skin is warm and dry.     Findings: No erythema.  Neurological:     Mental Status: He is alert.     Motor: No abnormal muscle tone.  Psychiatric:        Behavior: Behavior normal.        Judgment: Judgment normal.    Prostate exam normal Testicular exam normal no hernia detected no tenderness in the right groin Patient defers on flu shot     Assessment & Plan:  Adult wellness-complete.wellness physical was conducted today. Importance of diet and exercise were discussed in detail.  In addition to this a discussion regarding safety was also covered. We also reviewed over immunizations and gave recommendations regarding current immunization needed for age.  In addition to this additional areas were also touched on including: Preventative health exams needed:  Colonoscopy later in November  Patient was advised yearly wellness exam Right groin tenderness resolved Patient will monitor he thinks he might of pulled a muscle or ligament with leg lifts

## 2020-10-02 ENCOUNTER — Encounter: Payer: Self-pay | Admitting: Family Medicine

## 2020-10-02 ENCOUNTER — Other Ambulatory Visit: Payer: Self-pay

## 2020-10-02 ENCOUNTER — Ambulatory Visit: Payer: Managed Care, Other (non HMO) | Admitting: Family Medicine

## 2020-10-02 VITALS — BP 118/78 | HR 78 | Temp 94.4°F | Ht 69.0 in | Wt 222.6 lb

## 2020-10-02 DIAGNOSIS — Z23 Encounter for immunization: Secondary | ICD-10-CM

## 2020-10-02 DIAGNOSIS — W309XXA Contact with unspecified agricultural machinery, initial encounter: Secondary | ICD-10-CM

## 2020-10-02 DIAGNOSIS — S0101XA Laceration without foreign body of scalp, initial encounter: Secondary | ICD-10-CM

## 2020-10-02 NOTE — Progress Notes (Signed)
Pt has cut on top of head. Happened yesterday around 1 pm yesterday. Pt also having some neck soreness. Happened at home.    Patient ID: Eugene Horn, male    DOB: [birth year], 54 y.o.   MRN: [id number]   Chief Complaint  Patient presents with  . Head Injury   Subjective:   CC: cut on head, yesterday at 1300  Presents today after hitting his head on some farm equipment yesterday at 1:00 PM.  He did not lose consciousness.  There is approximately a 1 inch laceration on the top of his head, mostly approximated.  Has tried ibuprofen, ice.  He also reports that he jarred his neck with the injury and his neck is sore.  No headache no loss of consciousness no other associated symptoms.    Medical History Eugene Horn has no past medical history on file.   Outpatient Encounter Medications as of 10/02/2020  Medication Sig  . Multiple Vitamin (MULTIVITAMIN) tablet Take 1 tablet by mouth daily.   No facility-administered encounter medications on file as of 10/02/2020.     Review of Systems  Constitutional: Negative for chills and fever.  Respiratory: Negative for shortness of breath.   Cardiovascular: Negative for chest pain.  Gastrointestinal: Negative for abdominal pain.  Musculoskeletal: Positive for neck pain.       Soreness after hitting farm equipment.   Skin: Positive for wound.       1.5 inch laceration on top of head. Occurred at 1300 yesterday. Mostly approximated.      Vitals BP 118/78   Pulse 78   Temp (!) 94.4 F (34.7 C)   Ht 5\' 9"  (1.753 m)   Wt 222 lb 9.6 oz (101 kg)   SpO2 98%   BMI 32.87 kg/m   Objective:   Physical Exam Constitutional:      Appearance: Normal appearance.  HENT:     Head: Normocephalic.  Neck:     Comments: Reports "soreness" after hitting head on farm equipment. Felt like it jarred his neck.  Cardiovascular:     Rate and Rhythm: Regular rhythm.     Heart sounds: Normal heart sounds.  Pulmonary:     Effort: Pulmonary effort is  normal.     Breath sounds: Normal breath sounds.  Musculoskeletal:     Cervical back: Normal range of motion.  Skin:    General: Skin is warm and dry.     Comments: Approximately 1 inch laceration on top of head from hitting on farm equipment yesterday. Mostly approximated, small area of "moistness" noted.   Neurological:     Mental Status: He is alert and oriented to person, place, and time.  Psychiatric:        Mood and Affect: Mood normal.        Behavior: Behavior normal.        Thought Content: Thought content normal.        Judgment: Judgment normal.      Assessment and Plan   1. Need for vaccination - Tdap vaccine greater than or equal to 7yo IM  2. Laceration of skin of scalp, initial encounter   Approximately 1 inch laceration to the top of the head, wound edges are mostly approximated, there is a small area that slightly opened.  No immediate need for sutures, and the injury happened greater than 12 hours ago.  Encouraged to keep the area cleansed gently with soap and water, apply hydrogen peroxide, take pictures of the area every day to  compare to see if any infection starts.  He is going to watch it, clean it, and he will receive his Tdap today.  Two 4 x 4 bandages were given, to place over the area when he wears a hat.  Agrees with plan of care discussed today. Understands warning signs to seek further care: Fever, chills, increased redness, purulent drainage/pus. Understands to follow-up if does not heal, looks infected.  He is not diabetic, should heal appropriately.    Novella Olive, NP 10/02/2020

## 2020-10-02 NOTE — Patient Instructions (Signed)
Keep an eye on the cut on head. Take picture every day and let us know if it looks different. For the neck, take ibuprofen with food and alternate ice and heat.    Wound Care, Adult Taking care of your wound properly can help to prevent pain, infection, and scarring. It can also help your wound to heal more quickly. How to care for your wound Wound care      Follow instructions from your health care provider about how to take care of your wound. Make sure you: ? Wash your hands with soap and water before you change the bandage (dressing). If soap and water are not available, use hand sanitizer. ? Change your dressing as told by your health care provider. ? Leave stitches (sutures), skin glue, or adhesive strips in place. These skin closures may need to stay in place for 2 weeks or longer. If adhesive strip edges start to loosen and curl up, you may trim the loose edges. Do not remove adhesive strips completely unless your health care provider tells you to do that.  Check your wound area every day for signs of infection. Check for: ? Redness, swelling, or pain. ? Fluid or blood. ? Warmth. ? Pus or a bad smell.  Ask your health care provider if you should clean the wound with mild soap and water. Doing this may include: ? Using a clean towel to pat the wound dry after cleaning it. Do not rub or scrub the wound. ? Applying a cream or ointment. Do this only as told by your health care provider. ? Covering the incision with a clean dressing.  Ask your health care provider when you can leave the wound uncovered.  Keep the dressing dry until your health care provider says it can be removed. Do not take baths, swim, use a hot tub, or do anything that would put the wound underwater until your health care provider approves. Ask your health care provider if you can take showers. You may only be allowed to take sponge baths. Medicines   If you were prescribed an antibiotic medicine, cream,  or ointment, take or use the antibiotic as told by your health care provider. Do not stop taking or using the antibiotic even if your condition improves.  Take over-the-counter and prescription medicines only as told by your health care provider. If you were prescribed pain medicine, take it 30 or more minutes before you do any wound care or as told by your health care provider. General instructions  Return to your normal activities as told by your health care provider. Ask your health care provider what activities are safe.  Do not scratch or pick at the wound.  Do not use any products that contain nicotine or tobacco, such as cigarettes and e-cigarettes. These may delay wound healing. If you need help quitting, ask your health care provider.  Keep all follow-up visits as told by your health care provider. This is important.  Eat a diet that includes protein, vitamin A, vitamin C, and other nutrient-rich foods to help the wound heal. ? Foods rich in protein include meat, dairy, beans, nuts, and other sources. ? Foods rich in vitamin A include carrots and dark green, leafy vegetables. ? Foods rich in vitamin C include citrus, tomatoes, and other fruits and vegetables. ? Nutrient-rich foods have protein, carbohydrates, fat, vitamins, or minerals. Eat a variety of healthy foods including vegetables, fruits, and whole grains. Contact a health care provider if:  You received  a tetanus shot and you have swelling, severe pain, redness, or bleeding at the injection site.  Your pain is not controlled with medicine.  You have redness, swelling, or pain around the wound.  You have fluid or blood coming from the wound.  Your wound feels warm to the touch.  You have pus or a bad smell coming from the wound.  You have a fever or chills.  You are nauseous or you vomit.  You are dizzy. Get help right away if:  You have a red streak going away from your wound.  The edges of the wound open up  and separate.  Your wound is bleeding, and the bleeding does not stop with gentle pressure.  You have a rash.  You faint.  You have trouble breathing. Summary  Always wash your hands with soap and water before changing your bandage (dressing).  To help with healing, eat foods that are rich in protein, vitamin A, vitamin C, and other nutrients.  Check your wound every day for signs of infection. Contact your health care provider if you suspect that your wound is infected. This information is not intended to replace advice given to you by your health care provider. Make sure you discuss any questions you have with your health care provider. Document Revised: 03/11/2019 Document Reviewed: 06/07/2016 Elsevier Patient Education  [place].

## 2021-08-13 ENCOUNTER — Telehealth: Payer: Self-pay | Admitting: Family Medicine

## 2021-08-13 NOTE — Telephone Encounter (Signed)
Mother called asking if we could send in something for Covid. His soon tested positive on Sunday and has been around him all week. Patient started having sx yesterday with a bad headache he took the test today and it came back positive. Mother states he has some other medical issues and that is why she is asking for something.  CB# [phone]

## 2021-08-13 NOTE — Telephone Encounter (Signed)
Pt contacted. Pt states he is having aches and congestion. Advised patient that in order to be prescribed anything we would need to see him. With only having one provider and no open slots pt advised to go to Urgent Care. Pt verbalized understanding.

## 2022-01-07 ENCOUNTER — Encounter: Payer: Self-pay | Admitting: Emergency Medicine

## 2022-01-07 ENCOUNTER — Other Ambulatory Visit: Payer: Self-pay

## 2022-01-07 ENCOUNTER — Telehealth: Payer: Self-pay | Admitting: Family Medicine

## 2022-01-07 ENCOUNTER — Ambulatory Visit
Admission: EM | Admit: 2022-01-07 | Discharge: 2022-01-07 | Disposition: A | Discharge status: Home / Self Care | Payer: Managed Care, Other (non HMO) | Attending: Family Medicine | Admitting: Family Medicine

## 2022-01-07 DIAGNOSIS — Z131 Encounter for screening for diabetes mellitus: Secondary | ICD-10-CM

## 2022-01-07 DIAGNOSIS — R1084 Generalized abdominal pain: Secondary | ICD-10-CM | POA: Diagnosis not present

## 2022-01-07 DIAGNOSIS — Z1159 Encounter for screening for other viral diseases: Secondary | ICD-10-CM

## 2022-01-07 DIAGNOSIS — R000 Tachycardia, unspecified: Secondary | ICD-10-CM

## 2022-01-07 DIAGNOSIS — Z130 Encounter for screening for diseases of the blood and blood-forming organs and certain disorders involving the immune mechanism: Secondary | ICD-10-CM

## 2022-01-07 DIAGNOSIS — R52 Pain, unspecified: Secondary | ICD-10-CM

## 2022-01-07 DIAGNOSIS — S161XXA Strain of muscle, fascia and tendon at neck level, initial encounter: Secondary | ICD-10-CM | POA: Diagnosis not present

## 2022-01-07 DIAGNOSIS — Z114 Encounter for screening for human immunodeficiency virus [HIV]: Secondary | ICD-10-CM

## 2022-01-07 DIAGNOSIS — Z125 Encounter for screening for malignant neoplasm of prostate: Secondary | ICD-10-CM

## 2022-01-07 DIAGNOSIS — Z13220 Encounter for screening for lipoid disorders: Secondary | ICD-10-CM

## 2022-01-07 MED ORDER — CYCLOBENZAPRINE HCL 10 MG PO TABS: 10.0000 mg | ORAL_TABLET | Freq: Every evening | ORAL | 0 refills | Status: DC | PRN

## 2022-01-07 NOTE — Telephone Encounter (Signed)
Last labs 09/2019: PSA, CBC, BMP, Liver, Lipid

## 2022-01-07 NOTE — Telephone Encounter (Signed)
Lipid, liver, metabolic 7, CBC, PSA, HIV antibody, hepatitis C antibody Wellness, CDC screening guidelines

## 2022-01-07 NOTE — ED Provider Notes (Signed)
[facility]    CSN: [id number] Arrival date & time: 01/07/22  0825      History   Chief Complaint Chief Complaint  Patient presents with   Neck Pain    HPI Eugene Horn is a 56 y.o. male.   Patient presenting today with about 3 weeks of bilateral neck stiffness and soreness that he attributed to heavy weightlifting several times weekly initially but then woke up this morning and states he "did not feel right ".  He states his knees, low back, neck, legs were achy and stiff and he was having some abdominal cramping, elevated heart rate on pulse oximeter up to 128.  He denies fever, chills, nausea, vomiting, diarrhea, constipation, chest pain, shortness of breath, palpitations, headache, dizziness, visual changes.  No known sick contacts recently.  No past history of cardiac issues.  No known medication changes, diet changes recently.  Has not been trying anything over-the-counter for symptoms.  Was able to get in with PCP today for evaluation but was able to schedule a physical for 01/24/2022 to follow-up on these issues.   History reviewed. No pertinent past medical history.  Patient Active Problem List   Diagnosis Date Noted   Laceration of skin of scalp 10/02/2020   Need for vaccination 10/02/2020    Past Surgical History:  Procedure Laterality Date   FINGER SURGERY Left    ROTATOR CUFF REPAIR Right    WRIST SURGERY         Home Medications    Prior to Admission medications   Medication Sig Start Date End Date Taking? Authorizing Provider  cyclobenzaprine (FLEXERIL) 10 MG tablet Take 1 tablet (10 mg total) by mouth at bedtime as needed for muscle spasms. 01/07/22  Yes Particia Nearing, PA-C  Multiple Vitamin (MULTIVITAMIN) tablet Take 1 tablet by mouth daily.   Yes [provider]    Family History History reviewed. No pertinent family history.  Social History Social History   Tobacco Use   Smoking status: Never   Smokeless tobacco:  Never  Substance Use Topics   Alcohol use: Never   Drug use: Never     Allergies   Patient has no known allergies.   Review of Systems Review of Systems Per HPI  Physical Exam Triage Vital Signs ED Triage Vitals  Enc Vitals Group     BP 01/07/22 0938 122/79     Pulse Rate 01/07/22 0938 (!) 108     Resp 01/07/22 0938 18     Temp 01/07/22 0938 99.1 F (37.3 C)     Temp Source 01/07/22 0938 Oral     SpO2 01/07/22 0938 96 %     Weight 01/07/22 0939 220 lb (99.8 kg)     Height 01/07/22 0939 5\' 9"  (1.753 m)     Head Circumference --      Peak Flow --      Pain Score 01/07/22 0939 4     Pain Loc --      Pain Edu? --      Excl. in GC? --    No data found.  Updated Vital Signs BP 122/79 (BP Location: Right Arm)    Pulse (!) 108    Temp 99.1 F (37.3 C) (Oral)    Resp 18    Ht 5\' 9"  (1.753 m)    Wt 220 lb (99.8 kg)    SpO2 96%    BMI 32.49 kg/m   Visual Acuity Right Eye Distance:  Left Eye Distance:   Bilateral Distance:    Right Eye Near:   Left Eye Near:    Bilateral Near:     Physical Exam Vitals and nursing note reviewed.  Constitutional:      Appearance: Normal appearance.  HENT:     Head: Atraumatic.     Right Ear: Tympanic membrane normal.     Left Ear: Tympanic membrane normal.     Nose: Nose normal.     Mouth/Throat:     Mouth: Mucous membranes are moist.     Pharynx: Oropharynx is clear.  Eyes:     Extraocular Movements: Extraocular movements intact.     Conjunctiva/sclera: Conjunctivae normal.  Cardiovascular:     Rate and Rhythm: Normal rate and regular rhythm.     Heart sounds: Normal heart sounds.  Pulmonary:     Effort: Pulmonary effort is normal.     Breath sounds: Normal breath sounds. No wheezing or rales.  Abdominal:     General: Bowel sounds are normal. There is no distension.     Palpations: Abdomen is soft.     Tenderness: There is no abdominal tenderness. There is no right CVA tenderness, left CVA tenderness or guarding.   Musculoskeletal:        General: Tenderness present. No swelling or signs of injury. Normal range of motion.     Cervical back: Normal range of motion and neck supple.     Comments: Mild trapezius and SCM tenderness to palpation bilaterally, left greater than right and mild spasm. No midline spinal tenderness to palpation diffusely.  Range of motion full and intact, gait normal.  Skin:    General: Skin is warm and dry.  Neurological:     General: No focal deficit present.     Mental Status: He is oriented to person, place, and time.  Psychiatric:        Mood and Affect: Mood normal.        Thought Content: Thought content normal.        Judgment: Judgment normal.     UC Treatments / Results  Labs (all labs ordered are listed, but only abnormal results are displayed) Labs Reviewed  COVID-19, FLU A+B NAA    EKG   Radiology No results found.  Procedures Procedures (including critical care time)  Medications Ordered in UC Medications - No data to display  Initial Impression / Assessment and Plan / UC Course  I have reviewed the triage vital signs and the nursing notes.  Pertinent labs & imaging results that were available during my care of the patient were reviewed by me and considered in my medical decision making (see chart for details).     Mildly tachycardic when up and moving in triage, but at rest heart rate was ranging between 79 to 83 bpm both on EKG and recheck with oximeter.  Otherwise vital signs benign and reassuring, exam reassuring with no significant red flag findings.  EKG today showing normal sinus rhythm at 79 bpm without acute ST or T wave changes.  Suspect his ongoing neck soreness may be related to recent stressors, possibly sleep position or weightlifting regimen.  Trial Flexeril at bedtime, stretches, heat, pillow change.  Regarding his new body aches, COVID and flu testing pending, hydrate, rest, continue to monitor.  Follow-up with PCP if symptoms  persisting.  Return or go to the emergency department for worsening symptoms.  Final Clinical Impressions(s) / UC Diagnoses   Final diagnoses:  Body aches  Tachycardia  Generalized abdominal pain  Strain of neck muscle, initial encounter   Discharge Instructions   None    ED Prescriptions     Medication Sig Dispense Auth. Provider   cyclobenzaprine (FLEXERIL) 10 MG tablet Take 1 tablet (10 mg total) by mouth at bedtime as needed for muscle spasms. 10 tablet Volney American, [place]      PDMP not reviewed this encounter.   Volney American, [place] 01/07/22 1137

## 2022-01-07 NOTE — ED Triage Notes (Signed)
Pt reports neck stiffness, bilateral knees, back pain x3 weeks. Pt reports lifts weights and reports "I stay sore all the time but I just feel weird" pt reports checked pulse ox and heart at home and reports heart rate was elevated and reports intermittent abdominal cramping since this am. Nad noted.   Pt denies any chest pain and shortness of breath and reports has full physical scheduled with PCP on 01/24/22.

## 2022-01-07 NOTE — Telephone Encounter (Signed)
Blood work ordered in Epic. Telephone call- voicemail full °

## 2022-01-07 NOTE — Telephone Encounter (Signed)
Patient has physical on 2/20 and needing labs

## 2022-01-08 LAB — COVID-19, FLU A+B NAA
Influenza A, NAA: NOT DETECTED
Influenza B, NAA: NOT DETECTED
SARS-CoV-2, NAA: NOT DETECTED

## 2022-01-12 NOTE — Telephone Encounter (Signed)
Patient notified

## 2022-01-19 LAB — CBC WITH DIFFERENTIAL/PLATELET
Basophils Absolute: 0.1 10*3/uL (ref 0.0–0.2)
Basos: 1 %
EOS (ABSOLUTE): 0.2 10*3/uL (ref 0.0–0.4)
Eos: 4 %
Hematocrit: 44.2 % (ref 37.5–51.0)
Hemoglobin: 15.6 g/dL (ref 13.0–17.7)
Immature Grans (Abs): 0 10*3/uL (ref 0.0–0.1)
Immature Granulocytes: 0 %
Lymphocytes Absolute: 1.8 10*3/uL (ref 0.7–3.1)
Lymphs: 35 %
MCH: 31.5 pg (ref 26.6–33.0)
MCHC: 35.3 g/dL (ref 31.5–35.7)
MCV: 89 fL (ref 79–97)
Monocytes Absolute: 0.4 10*3/uL (ref 0.1–0.9)
Monocytes: 8 %
Neutrophils Absolute: 2.6 10*3/uL (ref 1.4–7.0)
Neutrophils: 52 %
Platelets: 288 10*3/uL (ref 150–450)
RBC: 4.95 x10E6/uL (ref 4.14–5.80)
RDW: 12.8 % (ref 11.6–15.4)
WBC: 5.1 10*3/uL (ref 3.4–10.8)

## 2022-01-19 LAB — BASIC METABOLIC PANEL
BUN/Creatinine Ratio: 25 — ABNORMAL HIGH (ref 9–20)
BUN: 22 mg/dL (ref 6–24)
CO2: 22 mmol/L (ref 20–29)
Calcium: 9.2 mg/dL (ref 8.7–10.2)
Chloride: 104 mmol/L (ref 96–106)
Creatinine, Ser: 0.89 mg/dL (ref 0.76–1.27)
Glucose: 101 mg/dL — ABNORMAL HIGH (ref 70–99)
Potassium: 4.6 mmol/L (ref 3.5–5.2)
Sodium: 140 mmol/L (ref 134–144)
eGFR: 102 mL/min/{1.73_m2} (ref >59)

## 2022-01-19 LAB — LIPID PANEL
Chol/HDL Ratio: 3.8 ratio (ref 0.0–5.0)
Cholesterol, Total: 137 mg/dL (ref 100–199)
HDL: 36 mg/dL — ABNORMAL LOW (ref >39)
LDL Chol Calc (NIH): 86 mg/dL (ref 0–99)
Triglycerides: 78 mg/dL (ref 0–149)
VLDL Cholesterol Cal: 15 mg/dL (ref 5–40)

## 2022-01-19 LAB — HEPATIC FUNCTION PANEL
ALT: 39 IU/L (ref 0–44)
AST: 28 IU/L (ref 0–40)
Albumin: 4.4 g/dL (ref 3.8–4.9)
Alkaline Phosphatase: 100 IU/L (ref 44–121)
Bilirubin Total: 0.3 mg/dL (ref 0.0–1.2)
Bilirubin, Direct: 0.1 mg/dL (ref 0.00–0.40)
Total Protein: 6.6 g/dL (ref 6.0–8.5)

## 2022-01-19 LAB — HEPATITIS C ANTIBODY: Hep C Virus Ab: NONREACTIVE

## 2022-01-19 LAB — PSA: Prostate Specific Ag, Serum: 0.8 ng/mL (ref 0.0–4.0)

## 2022-01-19 LAB — HIV ANTIBODY (ROUTINE TESTING W REFLEX): HIV Screen 4th Generation wRfx: NONREACTIVE

## 2022-01-24 ENCOUNTER — Encounter: Payer: Self-pay | Admitting: Family Medicine

## 2022-01-24 ENCOUNTER — Other Ambulatory Visit: Payer: Self-pay

## 2022-01-24 ENCOUNTER — Ambulatory Visit (INDEPENDENT_AMBULATORY_CARE_PROVIDER_SITE_OTHER): Payer: Managed Care, Other (non HMO) | Admitting: Family Medicine

## 2022-01-24 VITALS — BP 122/80 | HR 66 | Temp 97.5°F | Ht 68.0 in | Wt 224.6 lb

## 2022-01-24 DIAGNOSIS — Z0000 Encounter for general adult medical examination without abnormal findings: Secondary | ICD-10-CM | POA: Diagnosis not present

## 2022-01-24 NOTE — Patient Instructions (Signed)

## 2022-01-24 NOTE — Progress Notes (Signed)
° °  Subjective:    Patient ID: Eugene Horn, male    DOB: [birth year], 56 y.o.   MRN: [id number]  HPI The patient comes in today for a wellness visit.   Patient does weightlifting with high intensity repetitions 3 times per week also plays basketball with his son A review of their health history was completed.  A review of medications was also completed.  Any needed refills; none  Eating habits: Fair  Falls/  MVA accidents in past few months: none  Regular exercise: yes  Specialist pt sees on regular basis: none  Preventative health issues were discussed.   Additional concerns:     Review of Systems     Objective:   Physical Exam  General-in no acute distress Eyes-no discharge Lungs-respiratory rate normal, CTA CV-no murmurs,RRR Extremities skin warm dry no edema Neuro grossly normal Behavior normal, alert Prostate normal The 10-year ASCVD risk score (Arnett DK, et al., 2019) is: 4%   Values used to calculate the score:     Age: 27 years     Sex: Male     Is Non-Hispanic African American: No     Diabetic: No     Tobacco smoker: No     Systolic Blood Pressure: 122 mmHg     Is BP treated: No     HDL Cholesterol: 36 mg/dL     Total Cholesterol: 137 mg/dL      Assessment & Plan:  Adult wellness-complete.wellness physical was conducted today. Importance of diet and exercise were discussed in detail.  In addition to this a discussion regarding safety was also covered. We also reviewed over immunizations and gave recommendations regarding current immunization needed for age.  In addition to this additional areas were also touched on including: Preventative health exams needed:  Colonoscopy 2030  Patient was advised yearly wellness exam Lab work overall looks good Healthy diet regular activity recommended

## 2024-03-21 ENCOUNTER — Ambulatory Visit (INDEPENDENT_AMBULATORY_CARE_PROVIDER_SITE_OTHER): Admitting: Physician Assistant

## 2024-03-21 ENCOUNTER — Encounter: Payer: Self-pay | Admitting: Physician Assistant

## 2024-03-21 VITALS — BP 110/82 | Temp 97.3°F | Ht 68.0 in | Wt 232.0 lb

## 2024-03-21 DIAGNOSIS — Z13220 Encounter for screening for lipoid disorders: Secondary | ICD-10-CM

## 2024-03-21 DIAGNOSIS — Z0000 Encounter for general adult medical examination without abnormal findings: Secondary | ICD-10-CM

## 2024-03-21 DIAGNOSIS — Z125 Encounter for screening for malignant neoplasm of prostate: Secondary | ICD-10-CM

## 2024-03-21 NOTE — Progress Notes (Signed)
 Complete physical exam  Patient: Eugene Horn   DOB: 02/29/1966   58 y.o. Male  MRN: [id number]  Subjective:    Chief Complaint  Patient presents with   Annual Exam    Eugene Horn is a 58 y.o. male who presents today for a complete physical exam. He reports consuming a general diet.  He goes to the gym for strength training 2-3 times weekly and plays basketball once a week.  He generally feels well. He reports sleeping well. He does not have additional problems to discuss today.   A review of their health history was completed. A review of medications was also completed.   Most recent fall risk assessment:    03/21/2024    8:16 AM  Fall Risk   Falls in the past year? 0  Number falls in past yr: 0  Injury with Fall? 0  Risk for fall due to : No Fall Risks  Follow up Falls evaluation completed     Most recent depression screenings:    03/21/2024    8:16 AM 01/24/2022    1:25 PM  PHQ 2/9 Scores  PHQ - 2 Score 0 0  PHQ- 9 Score 0     Vision:Within last year and Dental: No current dental problems and Receives regular dental care  No care team member to display   Outpatient Medications Prior to Visit  Medication Sig   meloxicam (MOBIC) 15 MG tablet Take 15 mg by mouth daily.   Multiple Vitamin (MULTIVITAMIN) tablet Take 1 tablet by mouth daily.   No facility-administered medications prior to visit.    Review of Systems  Constitutional:  Negative for chills, fever and malaise/fatigue.  Eyes:  Negative for blurred vision and double vision.  Respiratory:  Negative for cough and shortness of breath.   Cardiovascular:  Negative for chest pain and palpitations.  Musculoskeletal:  Negative for joint pain and myalgias.  Neurological:  Negative for dizziness and headaches.  Psychiatric/Behavioral:  Negative for depression. The patient is not nervous/anxious.        Objective:     BP 110/82   Temp (!) 97.3 F (36.3 C)   Ht 5\' 8"  (1.727 m)   Wt 232 lb (105.2  kg)   BMI 35.28 kg/m    Physical Exam Constitutional:      Appearance: Normal appearance.  HENT:     Head: Normocephalic.     Right Ear: Tympanic membrane normal.     Left Ear: Tympanic membrane normal.     Mouth/Throat:     Mouth: Mucous membranes are moist.     Pharynx: Oropharynx is clear. No posterior oropharyngeal erythema.  Eyes:     Extraocular Movements: Extraocular movements intact.     Conjunctiva/sclera: Conjunctivae normal.  Cardiovascular:     Rate and Rhythm: Normal rate and regular rhythm.     Heart sounds: Normal heart sounds. No murmur heard. Pulmonary:     Effort: Pulmonary effort is normal.     Breath sounds: Normal breath sounds.  Musculoskeletal:     Cervical back: Normal range of motion and neck supple. No tenderness.     Right lower leg: No edema.     Left lower leg: No edema.  Lymphadenopathy:     Cervical: No cervical adenopathy.  Skin:    General: Skin is warm and dry.  Neurological:     General: No focal deficit present.     Mental Status: He is alert and oriented to person,  place, and time.  Psychiatric:        Mood and Affect: Mood normal.        Behavior: Behavior normal.      No results found for any visits on 03/21/24.     Assessment & Plan:    Routine Health Maintenance and Physical Exam  Health Maintenance  Topic Date Due   Zoster (Shingles) Vaccine (1 of 2) Never done   COVID-19 Vaccine (1 - 2024-25 season) Never done   Colon Cancer Screening  10/27/2029   DTaP/Tdap/Td vaccine (2 - Td or Tdap) 10/02/2030   Hepatitis C Screening  Completed   HIV Screening  Completed   HPV Vaccine  Aged Out   Meningitis B Vaccine  Aged Out   Flu Shot  Discontinued    Discussed health benefits of physical activity, and encouraged him to engage in regular exercise appropriate for his age and condition.  Problem List Items Addressed This Visit   None Visit Diagnoses       Annual visit for general adult medical examination without  abnormal findings    -  Primary   Relevant Orders   CMP14+EGFR   CBC with Differential/Platelet     Screening for prostate cancer       Relevant Orders   PSA     Screening for lipid disorders       Relevant Orders   Lipid panel      Adult wellness-complete.wellness physical was conducted today. Importance of diet and exercise were discussed in detail.   Importance of stress reduction and healthy living were discussed.  In addition to this a discussion regarding safety was also covered.   We also reviewed over immunizations and gave recommendations regarding current immunization needed for age.   Preventative health exams needed: Shingle vaccine  Colonoscopy due in 2030  Patient was advised yearly wellness exam   Return in about 1 year (around 03/21/2025).     Eugene Mince Davionte Lusby, PA-C

## 2024-03-22 ENCOUNTER — Encounter: Payer: Self-pay | Admitting: Physician Assistant

## 2024-03-22 LAB — CMP14+EGFR
ALT: 25 IU/L (ref 0–44)
AST: 28 IU/L (ref 0–40)
Albumin: 4.3 g/dL (ref 3.8–4.9)
Alkaline Phosphatase: 104 IU/L (ref 44–121)
BUN/Creatinine Ratio: 20 (ref 9–20)
BUN: 18 mg/dL (ref 6–24)
Bilirubin Total: 0.3 mg/dL (ref 0.0–1.2)
CO2: 24 mmol/L (ref 20–29)
Calcium: 9.1 mg/dL (ref 8.7–10.2)
Chloride: 103 mmol/L (ref 96–106)
Creatinine, Ser: 0.89 mg/dL (ref 0.76–1.27)
Globulin, Total: 2.4 g/dL (ref 1.5–4.5)
Glucose: 96 mg/dL (ref 70–99)
Potassium: 5.2 mmol/L (ref 3.5–5.2)
Sodium: 139 mmol/L (ref 134–144)
Total Protein: 6.7 g/dL (ref 6.0–8.5)
eGFR: 101 mL/min/{1.73_m2} (ref >59)

## 2024-03-22 LAB — CBC WITH DIFFERENTIAL/PLATELET
Basophils Absolute: 0.1 10*3/uL (ref 0.0–0.2)
Basos: 2 %
EOS (ABSOLUTE): 0.2 10*3/uL (ref 0.0–0.4)
Eos: 5 %
Hematocrit: 43.7 % (ref 37.5–51.0)
Hemoglobin: 15 g/dL (ref 13.0–17.7)
Immature Grans (Abs): 0 10*3/uL (ref 0.0–0.1)
Immature Granulocytes: 0 %
Lymphocytes Absolute: 1.6 10*3/uL (ref 0.7–3.1)
Lymphs: 31 %
MCH: 30.9 pg (ref 26.6–33.0)
MCHC: 34.3 g/dL (ref 31.5–35.7)
MCV: 90 fL (ref 79–97)
Monocytes Absolute: 0.5 10*3/uL (ref 0.1–0.9)
Monocytes: 10 %
Neutrophils Absolute: 2.7 10*3/uL (ref 1.4–7.0)
Neutrophils: 52 %
Platelets: 273 10*3/uL (ref 150–450)
RBC: 4.86 x10E6/uL (ref 4.14–5.80)
RDW: 12.4 % (ref 11.6–15.4)
WBC: 5.2 10*3/uL (ref 3.4–10.8)

## 2024-03-22 LAB — LIPID PANEL
Chol/HDL Ratio: 3.6 ratio (ref 0.0–5.0)
Cholesterol, Total: 148 mg/dL (ref 100–199)
HDL: 41 mg/dL (ref >39)
LDL Chol Calc (NIH): 94 mg/dL (ref 0–99)
Triglycerides: 63 mg/dL (ref 0–149)
VLDL Cholesterol Cal: 13 mg/dL (ref 5–40)

## 2024-03-22 LAB — PSA: Prostate Specific Ag, Serum: 0.9 ng/mL (ref 0.0–4.0)

## 2024-06-27 ENCOUNTER — Ambulatory Visit
Admission: RE | Admit: 2024-06-27 | Discharge: 2024-06-27 | Disposition: A | Discharge status: Home / Self Care | Source: Ambulatory Visit | Attending: Nurse Practitioner | Admitting: Nurse Practitioner

## 2024-06-27 VITALS — BP 138/83 | HR 62 | Temp 97.9°F | Resp 18

## 2024-06-27 DIAGNOSIS — R21 Rash and other nonspecific skin eruption: Secondary | ICD-10-CM | POA: Diagnosis not present

## 2024-06-27 MED ORDER — DEXAMETHASONE SODIUM PHOSPHATE 10 MG/ML IJ SOLN
10.0000 mg | INTRAMUSCULAR | Status: AC
  Administered 2024-06-27: 10 mg via INTRAMUSCULAR

## 2024-06-27 MED ORDER — TRIAMCINOLONE ACETONIDE 0.1 % EX CREA: 1.0000 | TOPICAL_CREAM | Freq: Two times a day (BID) | CUTANEOUS | 0 refills | Status: AC

## 2024-06-27 MED ORDER — PREDNISONE 20 MG PO TABS: 40.0000 mg | ORAL_TABLET | Freq: Every day | ORAL | 0 refills | Status: AC

## 2024-06-27 NOTE — ED Provider Notes (Signed)
 [facility]    CSN: [id number] Arrival date & time: 06/27/24  1240      History   Chief Complaint Chief Complaint  Patient presents with   Rash    Severe poison oak or ivy - Entered by patient    HPI Eugene Horn is a 58 y.o. male.   The history is provided by the patient.   Patient presents for complaints of rash to the generalized body area has been present for the past several days.  Patient states prior to his symptoms starting, [place]  He states the next morning when he woke up, he developed a rash on his ankles and feet which has since spread to his lower extremities, back, and upper extremities.  Patient states that the rash is itchy.  He denies exposure to new soaps, medications, lotions, foods, or detergents.  He further denies fever or chills.  Patient states he has been using a substance called tech nu and calamine lotion with minimal relief.  History reviewed. No pertinent past medical history.  Patient Active Problem List   Diagnosis Date Noted   Laceration of skin of scalp 10/02/2020   Need for vaccination 10/02/2020    Past Surgical History:  Procedure Laterality Date   FINGER SURGERY Left    ROTATOR CUFF REPAIR Right    WRIST SURGERY         Home Medications    Prior to Admission medications   Medication Sig Start Date End Date Taking? Authorizing Provider  predniSONE  (DELTASONE ) 20 MG tablet Take 2 tablets (40 mg total) by mouth daily with breakfast for 5 days. 06/27/24 07/02/24 Yes Leath-Warren, Etta PARAS, NP  triamcinolone  cream (KENALOG ) 0.1 % Apply 1 Application topically 2 (two) times daily. 06/27/24  Yes Leath-Warren, Etta PARAS, NP  meloxicam (MOBIC) 15 MG tablet Take 15 mg by mouth daily. 03/07/24   [provider]  Multiple Vitamin (MULTIVITAMIN) tablet Take 1 tablet by mouth daily.    [provider]    Family History History reviewed. No pertinent family history.  Social  History Social History   Tobacco Use   Smoking status: Never   Smokeless tobacco: Never  Substance Use Topics   Alcohol use: Never   Drug use: Never     Allergies   Patient has no known allergies.   Review of Systems Review of Systems Per HPI  Physical Exam Triage Vital Signs ED Triage Vitals [06/27/24 1303]  Encounter Vitals Group     BP 138/83     Girls Systolic BP Percentile      Girls Diastolic BP Percentile      Boys Systolic BP Percentile      Boys Diastolic BP Percentile      Pulse Rate 62     Resp 18     Temp 97.9 F (36.6 C)     Temp Source Oral     SpO2 95 %     Weight      Height      Head Circumference      Peak Flow      Pain Score 0     Pain Loc      Pain Education      Exclude from Growth Chart    No data found.  Updated Vital Signs BP 138/83 (BP Location: Right Arm)   Pulse 62   Temp 97.9 F (36.6 C) (Oral)   Resp 18   SpO2 95%  Visual Acuity Right Eye Distance:   Left Eye Distance:   Bilateral Distance:    Right Eye Near:   Left Eye Near:    Bilateral Near:     Physical Exam Vitals and nursing note reviewed.  Constitutional:      General: He is not in acute distress.    Appearance: Normal appearance.  HENT:     Head: Normocephalic.  Eyes:     Extraocular Movements: Extraocular movements intact.     Pupils: Pupils are equal, round, and reactive to light.  Pulmonary:     Effort: Pulmonary effort is normal.  Musculoskeletal:     Cervical back: Normal range of motion.  Skin:    General: Skin is warm and dry.     Findings: Rash present. Rash is macular and papular.     Comments: Course, erythematous macular papular rash noted to the bilateral lower extremities, feet, ankles, and bilateral upper extremities.  Rash is also located on the upper back.  The rash is in no congruent pattern.  There is no oozing, fluctuance, or drainage present.  Neurological:     General: No focal deficit present.     Mental Status: He is alert  and oriented to person, place, and time.  Psychiatric:        Mood and Affect: Mood normal.        Behavior: Behavior normal.      UC Treatments / Results  Labs (all labs ordered are listed, but only abnormal results are displayed) Labs Reviewed - No data to display  EKG   Radiology No results found.  Procedures Procedures (including critical care time)  Medications Ordered in UC Medications  dexamethasone  (DECADRON ) injection 10 mg (10 mg Intramuscular Given 06/27/24 1317)    Initial Impression / Assessment and Plan / UC Course  I have reviewed the triage vital signs and the nursing notes.  Pertinent labs & imaging results that were available during my care of the patient were reviewed by me and considered in my medical decision making (see chart for details).  Patient with rash and nonspecific skin eruption.  Decadron  10 mg IM administered for itching and inflammation.  Will start patient on prednisone  40 mg for the next 5 days along with triamcinolone  cream 0.1% for patient to apply topically.  Supportive care recommendations were provided discussed with the patient to include use of over-the-counter antihistamines, applying cool cloths to the affected areas, and use of Aveeno colloidal oatmeal bath.  Discussed indications with patient regarding follow-up.  Patient was in agreement with this plan of care and verbalizes understanding.  All questions were answered.  Patient stable for discharge. Final Clinical Impressions(s) / UC Diagnoses   Final diagnoses:  Rash and nonspecific skin eruption     Discharge Instructions      You were given an injection of Decadron  10 mg.  Start the prednisone  on 06/28/2024. Take medication as prescribed. You may take over-the-counter Zyrtec, Claritin, or Allegra during the daytime and Benadryl at bedtime to help with itching. Avoid hot baths or showers while symptoms persist.  Recommend taking lukewarm baths. May apply cool cloths to the  area to help with itching or discomfort. Avoid scratching, rubbing, or manipulating the areas while symptoms persist. Recommend Aveeno Colloidal Oatmeal Bath to use to help with drying and itching. Follow up if symptoms do not improve.      ED Prescriptions     Medication Sig Dispense Auth. Provider   predniSONE  (DELTASONE ) 20 MG  tablet Take 2 tablets (40 mg total) by mouth daily with breakfast for 5 days. 10 tablet Leath-Warren, Etta PARAS, NP   triamcinolone  cream (KENALOG ) 0.1 % Apply 1 Application topically 2 (two) times daily. 453.6 g Leath-Warren, Etta PARAS, NP      PDMP not reviewed this encounter.   Gilmer Etta PARAS, NP 06/27/24 1321

## 2024-06-27 NOTE — ED Triage Notes (Signed)
 PT reports he has had a rash x 2 days

## 2024-06-27 NOTE — Discharge Instructions (Signed)
 You were given an injection of Decadron  10 mg.  Start the prednisone  on 06/28/2024. Take medication as prescribed. You may take over-the-counter Zyrtec, Claritin, or Allegra during the daytime and Benadryl at bedtime to help with itching. Avoid hot baths or showers while symptoms persist.  Recommend taking lukewarm baths. May apply cool cloths to the area to help with itching or discomfort. Avoid scratching, rubbing, or manipulating the areas while symptoms persist. Recommend Aveeno Colloidal Oatmeal Bath to use to help with drying and itching. Follow up if symptoms do not improve.

## 2024-07-28 ENCOUNTER — Telehealth: Payer: Self-pay | Admitting: Family Medicine

## 2024-07-28 NOTE — Telephone Encounter (Signed)
 Hi Shannon Apparently patient was seen by Charmaine for a physical Patient's family relates that he received a bill because  Charmaine was not on his insurance and I was listed as his primary  Basically I told the family this really should not happen but that I would pass this message along Apparently he had called some number that told him he would look into it but they did not have the details  (It would seem to me that any provider in our office could see any of our patients and should be covered)  I wanted to make sure that somebody was looking into this issue I appreciate your help-please communicate back to family in regards to this issue thank you
